# Patient Record
Sex: Male | Born: 1995
Health system: Southern US, Community
[De-identification: ages and names within clinical notes are randomized; demographics above are authoritative.]

## PROBLEM LIST (undated history)

## (undated) DIAGNOSIS — N200 Calculus of kidney: Secondary | ICD-10-CM

## (undated) DIAGNOSIS — F909 Attention-deficit hyperactivity disorder, unspecified type: Secondary | ICD-10-CM

## (undated) HISTORY — PX: OTHER SURGICAL HISTORY: SHX169

---
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
## 2015-12-16 ENCOUNTER — Emergency Department (HOSPITAL_COMMUNITY)
Admission: EM | Admit: 2015-12-16 | Discharge: 2015-12-17 | Disposition: A | Discharge status: Home / Self Care | Payer: Medicaid Other | Attending: Emergency Medicine | Admitting: Emergency Medicine

## 2015-12-16 ENCOUNTER — Emergency Department (HOSPITAL_COMMUNITY): Payer: Medicaid Other

## 2015-12-16 ENCOUNTER — Encounter (HOSPITAL_COMMUNITY): Payer: Self-pay | Admitting: Emergency Medicine

## 2015-12-16 DIAGNOSIS — S4991XA Unspecified injury of right shoulder and upper arm, initial encounter: Secondary | ICD-10-CM | POA: Diagnosis present

## 2015-12-16 DIAGNOSIS — F909 Attention-deficit hyperactivity disorder, unspecified type: Secondary | ICD-10-CM | POA: Diagnosis not present

## 2015-12-16 DIAGNOSIS — Z79899 Other long term (current) drug therapy: Secondary | ICD-10-CM | POA: Insufficient documentation

## 2015-12-16 DIAGNOSIS — Y999 Unspecified external cause status: Secondary | ICD-10-CM | POA: Diagnosis not present

## 2015-12-16 DIAGNOSIS — Y9351 Activity, roller skating (inline) and skateboarding: Secondary | ICD-10-CM | POA: Insufficient documentation

## 2015-12-16 DIAGNOSIS — Y929 Unspecified place or not applicable: Secondary | ICD-10-CM | POA: Diagnosis not present

## 2015-12-16 DIAGNOSIS — S42001A Fracture of unspecified part of right clavicle, initial encounter for closed fracture: Secondary | ICD-10-CM

## 2015-12-16 MED ORDER — IBUPROFEN 800 MG PO TABS
800.0000 mg | ORAL_TABLET | Freq: Once | ORAL | Status: AC
  Administered 2015-12-16: 800 mg via ORAL
  Filled 2015-12-16: qty 1

## 2015-12-16 MED ORDER — OXYCODONE HCL 5 MG PO TABS
5.0000 mg | ORAL_TABLET | Freq: Once | ORAL | Status: AC
  Administered 2015-12-16: 5 mg via ORAL
  Filled 2015-12-16: qty 1

## 2015-12-16 MED ORDER — ACETAMINOPHEN 500 MG PO TABS
1000.0000 mg | ORAL_TABLET | Freq: Once | ORAL | Status: AC
  Administered 2015-12-16: 1000 mg via ORAL
  Filled 2015-12-16: qty 2

## 2015-12-16 MED ORDER — MORPHINE SULFATE 15 MG PO TABS: 15.0000 mg | ORAL_TABLET | ORAL | 0 refills | Status: DC | PRN

## 2015-12-16 NOTE — ED Triage Notes (Signed)
Pt states he was riding his long board and it came out from under him and he landed on the sidewalk on his right shoulder Pt is c/o pain to his right clavicle area  Pt has abrasions noted to his face on the right side  Denies any LOC  States unable to move his right arm due to pain

## 2015-12-16 NOTE — ED Provider Notes (Signed)
[facility]-EMERGENCY DEPT Provider Note   CSN: [id number] Arrival date & time: 12/16/15  1951 By signing my name below, I, Alex Davis, attest that this documentation has been prepared under the direction and in the presence of Melene Planan [name], DO . Electronically Signed: Levon HedgerElizabeth Davis, Scribe. 12/17/2015. 12:02 AM.   History   Chief Complaint Chief Complaint  Patient presents with  . Shoulder Injury    HPI [name] is a 20 y.o. male who presents to the Emergency Department complaining of sudden onset, moderate right shoulder pain  fall. He states he was long boarding when his long board came out from under him, causing him to fall and land on his right side. Pt states the pain does not radiate. No alleviating or modifying factors noted. Pt denies syncope or neck pain.  Per mother, pt was seen in the ED s/p fall one year ago and was diagnosed with a concussion. Pt states he feels as if his concussion has never gone away and notes a constant headache.    The history is provided by the patient.  No language interpreter was used.    Past Medical History:  Diagnosis Date  . ADHD (attention deficit hyperactivity disorder)    off all medications for 6 months  . Kidney stone   . Kidney stones 09/2012   left ureteral stone    There are no active problems to display for this patient.   Past Surgical History:  Procedure Laterality Date  . laser stone removal       Home Medications    Prior to Admission medications   Medication Sig Start Date End Date Taking? Authorizing Provider  HYDROmorphone (DILAUDID) 4 MG tablet Take 0.5-1 tablets (2-4 mg total) by mouth every 4 (four) hours as needed for pain. Patient not taking: Reported on 11/03/2014 09/24/12   Paula LibraJohn Molpus, MD  morphine (MSIR) 15 MG tablet Take 1 tablet (15 mg total) by mouth every 4 (four) hours as needed for severe pain. 12/16/15   Melene Planan Tinsley Lomas, DO  ondansetron (ZOFRAN ODT) 8 MG disintegrating tablet Take 1 tablet (8 mg total) by  mouth every 8 (eight) hours as needed. Patient not taking: Reported on 11/03/2014 09/24/12   Paula LibraJohn Molpus, MD  tamsulosin (FLOMAX) 0.4 MG CAPS Take 1 capsule daily until stone passes. Patient not taking: Reported on 11/03/2014 09/24/12   Paula LibraJohn Molpus, MD    Family History Family History  Problem Relation Age of Onset  . Stroke Other   . Hypertension Other   . Diabetes Other     Social History Social History  Substance Use Topics  . Smoking status: Never Smoker  . Smokeless tobacco: Never Used  . Alcohol use No     Allergies   Review of patient's allergies indicates no known allergies.   Review of Systems Review of Systems  Constitutional: Negative for chills and fever.  HENT: Negative for congestion and facial swelling.   Eyes: Negative for discharge and visual disturbance.  Respiratory: Negative for shortness of breath.   Cardiovascular: Negative for chest pain and palpitations.  Gastrointestinal: Negative for abdominal pain, diarrhea and vomiting.  Musculoskeletal: Positive for arthralgias. Negative for myalgias.  Skin: Negative for color change and rash.  Neurological: Negative for tremors, syncope and headaches.  Psychiatric/Behavioral: Negative for confusion and dysphoric mood.   Physical Exam Updated Vital Signs BP 133/99 (BP Location: Left Arm)   Pulse 66   Temp 98.5 F (36.9 C) (Oral)   Resp 18   SpO2  98%   Physical Exam  Constitutional: He is oriented to person, place, and time. He appears well-developed and well-nourished.  HENT:  Head: Normocephalic and atraumatic.  Eyes: EOM are normal. Pupils are equal, round, and reactive to light.  Neck: Normal range of motion. Neck supple. No JVD present.  Cardiovascular: Normal rate and regular rhythm.  Exam reveals no gallop and no friction rub.   No murmur heard. Pulmonary/Chest: No respiratory distress. He has no wheezes.  Abdominal: He exhibits no distension. There is no rebound and no guarding.    Musculoskeletal: Normal range of motion. He exhibits tenderness.  TTP to the anterior aspect of his shoulder No c-spine tenderness Small abrasion to right side of his head No signs of basal skull fracture   Neurological: He is alert and oriented to person, place, and time.  Skin: No rash noted. No pallor.  Psychiatric: He has a normal mood and affect. His behavior is normal.  Nursing note and vitals reviewed.  ED Treatments / Results  DIAGNOSTIC STUDIES:  Oxygen Saturation is 100% on room air, normal by my interpretation.    COORDINATION OF CARE:  11:47 PM Discussed treatment plan with pt at bedside and pt agreed to plan.  Labs (all labs ordered are listed, but only abnormal results are displayed) Labs Reviewed - No data to display  EKG  EKG Interpretation None       Radiology Dg Shoulder Right  Result Date: 12/16/2015 CLINICAL DATA:  Pain after falling from skateboard EXAM: RIGHT SHOULDER - 2+ VIEW COMPARISON:  None. FINDINGS: Two views of the right shoulder submitted. There is displaced fracture of distal right clavicle. Glenohumeral joint is preserved. IMPRESSION: Displaced fracture of distal right clavicle. Electronically Signed   By: Natasha Mead M.D.   On: 12/16/2015 20:40    Procedures Procedures (including critical care time)  Medications Ordered in ED Medications  ibuprofen (ADVIL,MOTRIN) tablet 800 mg (800 mg Oral Given 12/16/15 2353)  oxyCODONE (Oxy IR/ROXICODONE) immediate release tablet 5 mg (5 mg Oral Given 12/16/15 2352)  acetaminophen (TYLENOL) tablet 1,000 mg (1,000 mg Oral Given 12/16/15 2352)     Initial Impression / Assessment and Plan / ED Course  I have reviewed the triage vital signs and the nursing notes.  Pertinent labs & imaging results that were available during my care of the patient were reviewed by me and considered in my medical decision making (see chart for details).  Clinical Course    20 yo M With a chief complaint of right shoulder  pain. Patient has tenderness about the shoulder pulse motor and sensation is intact distally. Found to have a distal third clavicle fracture. Orthopedics follow-up.  12:02 AM:  I have discussed the diagnosis/risks/treatment options with the patient and family and believe the pt to be eligible for discharge home to follow-up with Ortho. We also discussed returning to the ED immediately if new or worsening sx occur. We discussed the sx which are most concerning (e.g., ongoing severe pain, confusion) that necessitate immediate return. Medications administered to the patient during their visit and any new prescriptions provided to the patient are listed below.  Medications given during this visit Medications  ibuprofen (ADVIL,MOTRIN) tablet 800 mg (800 mg Oral Given 12/16/15 2353)  oxyCODONE (Oxy IR/ROXICODONE) immediate release tablet 5 mg (5 mg Oral Given 12/16/15 2352)  acetaminophen (TYLENOL) tablet 1,000 mg (1,000 mg Oral Given 12/16/15 2352)     The patient appears reasonably screen and/or stabilized for discharge and I doubt any  other medical condition or other [facility] requiring further screening, evaluation, or treatment in the ED at this time prior to discharge.    Final Clinical Impressions(s) / ED Diagnoses   Final diagnoses:  Clavicle fracture, right, closed, initial encounter   I personally performed the services described in this documentation, which was scribed in my presence. The recorded information has been reviewed and is accurate.    New Prescriptions New Prescriptions   MORPHINE (MSIR) 15 MG TABLET    Take 1 tablet (15 mg total) by mouth every 4 (four) hours as needed for severe pain.    Melene Plan, DO 12/17/15 0002

## 2015-12-17 NOTE — Discharge Instructions (Signed)

## 2017-02-03 ENCOUNTER — Encounter (HOSPITAL_COMMUNITY): Payer: Self-pay | Admitting: Emergency Medicine

## 2017-02-03 ENCOUNTER — Emergency Department (HOSPITAL_COMMUNITY)
Admission: EM | Admit: 2017-02-03 | Discharge: 2017-02-03 | Disposition: A | Discharge status: Home / Self Care | Payer: Medicaid Other | Attending: Emergency Medicine | Admitting: Emergency Medicine

## 2017-02-03 DIAGNOSIS — M545 Low back pain, unspecified: Secondary | ICD-10-CM

## 2017-02-03 DIAGNOSIS — R109 Unspecified abdominal pain: Secondary | ICD-10-CM | POA: Diagnosis present

## 2017-02-03 LAB — CBC
HCT: 46.5 % (ref 39.0–52.0)
Hemoglobin: 16.4 g/dL (ref 13.0–17.0)
MCH: 30.8 pg (ref 26.0–34.0)
MCHC: 35.3 g/dL (ref 30.0–36.0)
MCV: 87.4 fL (ref 78.0–100.0)
Platelets: 251 10*3/uL (ref 150–400)
RBC: 5.32 MIL/uL (ref 4.22–5.81)
RDW: 12.6 % (ref 11.5–15.5)
WBC: 7.5 10*3/uL (ref 4.0–10.5)

## 2017-02-03 LAB — BASIC METABOLIC PANEL WITH GFR
Anion gap: 8 (ref 5–15)
BUN: 9 mg/dL (ref 6–20)
CO2: 28 mmol/L (ref 22–32)
Calcium: 9.3 mg/dL (ref 8.9–10.3)
Chloride: 104 mmol/L (ref 101–111)
Creatinine, Ser: 0.84 mg/dL (ref 0.61–1.24)
GFR calc Af Amer: >60 mL/min
GFR calc non Af Amer: >60 mL/min
Glucose, Bld: 98 mg/dL (ref 65–99)
Potassium: 4.3 mmol/L (ref 3.5–5.1)
Sodium: 140 mmol/L (ref 135–145)

## 2017-02-03 LAB — URINALYSIS, ROUTINE W REFLEX MICROSCOPIC
BILIRUBIN URINE: NEGATIVE
Glucose, UA: NEGATIVE mg/dL
Hgb urine dipstick: NEGATIVE
Ketones, ur: NEGATIVE mg/dL
Leukocytes, UA: NEGATIVE
NITRITE: NEGATIVE
PH: 6 (ref 5.0–8.0)
PROTEIN: NEGATIVE mg/dL
Specific Gravity, Urine: 1.005 (ref 1.005–1.030)

## 2017-02-03 MED ORDER — ACETAMINOPHEN 500 MG PO TABS
1000.0000 mg | ORAL_TABLET | Freq: Once | ORAL | Status: AC
  Administered 2017-02-03: 1000 mg via ORAL
  Filled 2017-02-03: qty 2

## 2017-02-03 NOTE — ED Notes (Signed)
Pt provided with heat pack for pain control

## 2017-02-03 NOTE — ED Triage Notes (Addendum)
Per pt, states right and left flank pain radiating to his stomach-states it is twisting-states history of kidney stones-states pain feels like that-started 1 1/2 hours ago-no dysuria, no N/V

## 2017-02-03 NOTE — Discharge Instructions (Signed)
Take Tylenol as directed for pain.  Call the number on these discharge instructions to get a primary care physician and to be seen if not feeling better in a week.  Return if concern for any reason or see an urgent care center

## 2017-02-03 NOTE — ED Provider Notes (Addendum)
[facility]-EMERGENCY DEPT Provider Note   CSN: [id number] Arrival date & time: 02/03/17  1418     History   Chief Complaint Chief Complaint  Patient presents with  . Flank Pain    HPI Alex Davis is a 21 y.o. male.  HPI Complains of right-sided paralumbar pain onset 12 noon today pain radiates to both sides of his abdomen, diffusely.  It does not feel like kidney stones he had in the past which was unilateral.  He denies any urinary symptoms denies nausea or vomiting denies appetite change denies fever pain is made worse with movement or changing positions and improved with remaining still.  Last bowel movement 1 PM normal pain is mild at present and has improved since onset Past Medical History:  Diagnosis Date  . ADHD (attention deficit hyperactivity disorder)    off all medications for 6 months  . Kidney stone   . Kidney stones 09/2012   left ureteral stone    There are no active problems to display for this patient.   Past Surgical History:  Procedure Laterality Date  . laser stone removal         Home Medications    Prior to Admission medications   Medication Sig Start Date End Date Taking? Authorizing Provider  HYDROmorphone (DILAUDID) 4 MG tablet Take 0.5-1 tablets (2-4 mg total) by mouth every 4 (four) hours as needed for pain. Patient not taking: Reported on 11/03/2014 09/24/12   Molpus, Jonny Ruiz, MD  morphine (MSIR) 15 MG tablet Take 1 tablet (15 mg total) by mouth every 4 (four) hours as needed for severe pain. 12/16/15   Melene Plan, DO  ondansetron (ZOFRAN ODT) 8 MG disintegrating tablet Take 1 tablet (8 mg total) by mouth every 8 (eight) hours as needed. Patient not taking: Reported on 11/03/2014 09/24/12   Molpus, John, MD  tamsulosin (FLOMAX) 0.4 MG CAPS Take 1 capsule daily until stone passes. Patient not taking: Reported on 11/03/2014 09/24/12   Molpus, Jonny Ruiz, MD    Family History Family History  Problem Relation Age of Onset  .  Stroke Other   . Hypertension Other   . Diabetes Other     Social History Social History  Substance Use Topics  . Smoking status: Never Smoker  . Smokeless tobacco: Never Used  . Alcohol use No     Allergies   Patient has no known allergies.   Review of Systems Review of Systems  Constitutional: Negative.   HENT: Negative.   Respiratory: Negative.   Cardiovascular: Negative.   Gastrointestinal: Positive for abdominal pain.  Musculoskeletal: Positive for back pain.  Skin: Negative.   Neurological: Negative.   Psychiatric/Behavioral: Negative.   All other systems reviewed and are negative.    Physical Exam Updated Vital Signs BP 138/79 (BP Location: Left Arm)   Pulse 95   Temp 98.2 F (36.8 C) (Oral)   Resp 17   SpO2 98%   Physical Exam  Constitutional: He is oriented to person, place, and time. He appears well-developed and well-nourished.  HENT:  Head: Normocephalic and atraumatic.  Eyes: Pupils are equal, round, and reactive to light. Conjunctivae are normal.  Neck: Neck supple. No tracheal deviation present. No thyromegaly present.  Cardiovascular: Normal rate and regular rhythm.   No murmur heard. Pulmonary/Chest: Effort normal and breath sounds normal.  Abdominal: Soft. Bowel sounds are normal. He exhibits no distension. There is no tenderness.  Genitourinary: Penis normal.  Musculoskeletal: Normal range of motion. He exhibits  no edema or tenderness.  No tenderness along spine.  Has bilateral paralumbar pain upon sitting up from supine position  Neurological: He is alert and oriented to person, place, and time. No cranial nerve deficit. Coordination normal.  Gait normal  Skin: Skin is warm and dry. No rash noted.  Psychiatric: He has a normal mood and affect.  Nursing note and vitals reviewed.    ED Treatments / Results  Labs (all labs ordered are listed, but only abnormal results are displayed) Labs Reviewed  BASIC METABOLIC PANEL  CBC   URINALYSIS, ROUTINE W REFLEX MICROSCOPIC    EKG  EKG Interpretation None       Radiology No results found.  Procedures Procedures (including critical care time)  Medications Ordered in ED Medications  acetaminophen (TYLENOL) tablet 1,000 mg (not administered)   Results for orders placed or performed during the hospital encounter of 02/03/17  Urinalysis, Routine w reflex microscopic- may I&O cath if menses  Result Value Ref Range   Color, Urine STRAW (A) YELLOW   APPearance CLEAR CLEAR   Specific Gravity, Urine 1.005 1.005 - 1.030   pH 6.0 5.0 - 8.0   Glucose, UA NEGATIVE NEGATIVE mg/dL   Hgb urine dipstick NEGATIVE NEGATIVE   Bilirubin Urine NEGATIVE NEGATIVE   Ketones, ur NEGATIVE NEGATIVE mg/dL   Protein, ur NEGATIVE NEGATIVE mg/dL   Nitrite NEGATIVE NEGATIVE   Leukocytes, UA NEGATIVE NEGATIVE  Basic metabolic panel  Result Value Ref Range   Sodium 140 135 - 145 mmol/L   Potassium 4.3 3.5 - 5.1 mmol/L   Chloride 104 101 - 111 mmol/L   CO2 28 22 - 32 mmol/L   Glucose, Bld 98 65 - 99 mg/dL   BUN 9 6 - 20 mg/dL   Creatinine, Ser 4.690.84 0.61 - 1.24 mg/dL   Calcium 9.3 8.9 - 62.910.3 mg/dL   GFR calc non Af Amer >60 >60 mL/min   GFR calc Af Amer >60 >60 mL/min   Anion gap 8 5 - 15  CBC  Result Value Ref Range   WBC 7.5 4.0 - 10.5 K/uL   RBC 5.32 4.22 - 5.81 MIL/uL   Hemoglobin 16.4 13.0 - 17.0 g/dL   HCT 52.846.5 41.339.0 - 24.452.0 %   MCV 87.4 78.0 - 100.0 fL   MCH 30.8 26.0 - 34.0 pg   MCHC 35.3 30.0 - 36.0 g/dL   RDW 01.012.6 27.211.5 - 53.615.5 %   Platelets 251 150 - 400 K/uL   No results found.  Initial Impression / Assessment and Plan / ED Course  I have reviewed the triage vital signs and the nursing notes.  Pertinent labs & imaging results that were available during my care of the patient were reviewed by me and considered in my medical decision making (see chart for details).     5 PM discomfort continues to improve after treatment with Tylenol. Abdomen is completely  nontender.  Pain seems to be muscular in etiology and coming from his low back. Plan Tylenol as needed for pain and referral primary care Final Clinical Impressions(s) / ED Diagnoses  Diagnosis acute bilateral low back pain without sciatica Final diagnoses:  None    New Prescriptions New Prescriptions   No medications on file     Doug SouJacubowitz, Lougenia Morrissey, MD 02/03/17 1704    Doug SouJacubowitz, Ieasha Boerema, MD 02/03/17 1706    Doug SouJacubowitz, Mattisen Pohlmann, MD 02/03/17 [phone]

## 2017-10-24 ENCOUNTER — Emergency Department (HOSPITAL_BASED_OUTPATIENT_CLINIC_OR_DEPARTMENT_OTHER)
Admission: EM | Admit: 2017-10-24 | Discharge: 2017-10-24 | Disposition: A | Discharge status: Home / Self Care | Payer: Medicaid Other | Attending: Emergency Medicine | Admitting: Emergency Medicine

## 2017-10-24 ENCOUNTER — Other Ambulatory Visit: Payer: Self-pay

## 2017-10-24 ENCOUNTER — Encounter (HOSPITAL_BASED_OUTPATIENT_CLINIC_OR_DEPARTMENT_OTHER): Payer: Self-pay | Admitting: Emergency Medicine

## 2017-10-24 ENCOUNTER — Emergency Department (HOSPITAL_BASED_OUTPATIENT_CLINIC_OR_DEPARTMENT_OTHER): Payer: Medicaid Other

## 2017-10-24 DIAGNOSIS — F909 Attention-deficit hyperactivity disorder, unspecified type: Secondary | ICD-10-CM | POA: Insufficient documentation

## 2017-10-24 DIAGNOSIS — R51 Headache: Secondary | ICD-10-CM | POA: Insufficient documentation

## 2017-10-24 DIAGNOSIS — H5712 Ocular pain, left eye: Secondary | ICD-10-CM | POA: Diagnosis not present

## 2017-10-24 DIAGNOSIS — G8929 Other chronic pain: Secondary | ICD-10-CM | POA: Insufficient documentation

## 2017-10-24 LAB — CBC WITH DIFFERENTIAL/PLATELET
BASOS PCT: 1 %
Basophils Absolute: 0.1 10*3/uL (ref 0.0–0.1)
Eosinophils Absolute: 0.1 10*3/uL (ref 0.0–0.7)
Eosinophils Relative: 1 %
HEMATOCRIT: 44 % (ref 39.0–52.0)
Hemoglobin: 15.4 g/dL (ref 13.0–17.0)
LYMPHS PCT: 33 %
Lymphs Abs: 2.4 10*3/uL (ref 0.7–4.0)
MCH: 30 pg (ref 26.0–34.0)
MCHC: 35 g/dL (ref 30.0–36.0)
MCV: 85.6 fL (ref 78.0–100.0)
MONOS PCT: 11 %
Monocytes Absolute: 0.8 10*3/uL (ref 0.1–1.0)
NEUTROS ABS: 3.8 10*3/uL (ref 1.7–7.7)
Neutrophils Relative %: 54 %
Platelets: 237 10*3/uL (ref 150–400)
RBC: 5.14 MIL/uL (ref 4.22–5.81)
RDW: 12.5 % (ref 11.5–15.5)
WBC: 7.1 10*3/uL (ref 4.0–10.5)

## 2017-10-24 LAB — BASIC METABOLIC PANEL
ANION GAP: 7 (ref 5–15)
BUN: 9 mg/dL (ref 6–20)
CALCIUM: 8.8 mg/dL — AB (ref 8.9–10.3)
CO2: 28 mmol/L (ref 22–32)
Chloride: 104 mmol/L (ref 98–111)
Creatinine, Ser: 0.77 mg/dL (ref 0.61–1.24)
GFR calc Af Amer: >60 mL/min (ref >60)
GFR calc non Af Amer: >60 mL/min (ref >60)
GLUCOSE: 92 mg/dL (ref 70–99)
Potassium: 4.5 mmol/L (ref 3.5–5.1)
Sodium: 139 mmol/L (ref 135–145)

## 2017-10-24 MED ORDER — TETRACAINE HCL 0.5 % OP SOLN
2.0000 [drp] | Freq: Once | OPHTHALMIC | Status: AC
  Administered 2017-10-24: 2 [drp] via OPHTHALMIC
  Filled 2017-10-24: qty 4

## 2017-10-24 MED ORDER — FLUORESCEIN SODIUM 1 MG OP STRP
1.0000 | ORAL_STRIP | Freq: Once | OPHTHALMIC | Status: AC
  Administered 2017-10-24: 1 via OPHTHALMIC
  Filled 2017-10-24: qty 1

## 2017-10-24 MED ORDER — IOPAMIDOL (ISOVUE-300) INJECTION 61%: 75.0000 mL | Freq: Once | INTRAVENOUS | Status: DC | PRN

## 2017-10-24 MED ORDER — IOPAMIDOL (ISOVUE-300) INJECTION 61%
100.0000 mL | Freq: Once | INTRAVENOUS | Status: AC | PRN
  Administered 2017-10-24: 75 mL via INTRAVENOUS

## 2017-10-24 MED ORDER — BUTALBITAL-APAP-CAFFEINE 50-325-40 MG PO TABS: 1.0000 | ORAL_TABLET | Freq: Four times a day (QID) | ORAL | 0 refills | Status: AC | PRN

## 2017-10-24 MED FILL — BUTALB-ACETAMIN-CAFF 50-325: 50-325-40 | 1 days supply | Qty: 6 | Fill #0

## 2017-10-24 NOTE — ED Provider Notes (Signed)
[facility] EMERGENCY DEPARTMENT Provider Note   CSN: [id number] Arrival date & time: 10/24/17  1012     History   Chief Complaint Chief Complaint  Patient presents with  . Eye Pain    HPI [name] is a 22 y.o. male since emergency department chief complaint of left eye pain.  Symptom has been present for about 2 weeks.  He states that the pain is worse with eye movement.  He feels pressure behind the eye.  He complains of photophobia to bright light like the sign.  He has chronic headaches.  He denies nausea, vomiting or other neurologic symptoms.  He has no known injuries to the eye.  He does not wear glasses or contact lenses.  His mother states that he has been laying in a dark room more frequently for the past 2 weeks.  He has no other contributing past medical history specifically no autoimmune disorders.  HPI  Past Medical History:  Diagnosis Date  . ADHD (attention deficit hyperactivity disorder)    off all medications for 6 months  . Kidney stone   . Kidney stones 09/2012   left ureteral stone    There are no active problems to display for this patient.   Past Surgical History:  Procedure Laterality Date  . laser stone removal          Home Medications    Prior to Admission medications   Not on File    Family History Family History  Problem Relation Age of Onset  . Stroke Other   . Hypertension Other   . Diabetes Other     Social History Social History   Tobacco Use  . Smoking status: Never Smoker  . Smokeless tobacco: Never Used  Substance Use Topics  . Alcohol use: No  . Drug use: No     Allergies   Patient has no known allergies.   Review of Systems Review of Systems  Ten systems reviewed and are negative for acute change, except as noted in the HPI.   Physical Exam Updated Vital Signs BP 123/73 (BP Location: Right Arm)   Pulse 80   Temp 98.2 F (36.8 C) (Oral)   Resp 18   Ht 6\' 1"  (1.854 m)   Wt 102.1 kg  (225 lb)   SpO2 98%   BMI 29.69 kg/m   Physical Exam  Constitutional: He appears well-developed and well-nourished. No distress.  HENT:  Head: Normocephalic and atraumatic.  Eyes: Pupils are equal, round, and reactive to light. Conjunctivae and EOM are normal. Right eye exhibits no chemosis and no discharge. No foreign body present in the right eye. Left eye exhibits no chemosis and no discharge. No foreign body present in the left eye. Right conjunctiva is not injected. Left conjunctiva is not injected. No scleral icterus.  Slit lamp exam:      The right eye shows no corneal abrasion, no corneal ulcer, no foreign body and no fluorescein uptake.       The left eye shows no corneal abrasion, no corneal ulcer, no foreign body and no fluorescein uptake.    Neck: Normal range of motion. Neck supple.  Cardiovascular: Normal rate, regular rhythm and normal heart sounds.  Pulmonary/Chest: Effort normal and breath sounds normal. No respiratory distress.  Abdominal: Soft. There is no tenderness.  Musculoskeletal: He exhibits no edema.  Neurological: He is alert.  Skin: Skin is warm and dry. He is not diaphoretic.  Psychiatric: His behavior is normal.  Nursing note and vitals reviewed.    ED Treatments / Results  Labs (all labs ordered are listed, but only abnormal results are displayed) Labs Reviewed  BASIC METABOLIC PANEL  CBC WITH DIFFERENTIAL/PLATELET    EKG None  Radiology No results found.  Procedures Procedures (including critical care time)  Medications Ordered in ED Medications  fluorescein ophthalmic strip 1 strip (1 strip Both Eyes Given by Other 10/24/17 1143)  tetracaine (PONTOCAINE) 0.5 % ophthalmic solution 2 drop (2 drops Both Eyes Given by Other 10/24/17 1143)  iopamidol (ISOVUE-300) 61 % injection 100 mL (75 mLs Intravenous Contrast Given 10/24/17 1114)     Initial Impression / Assessment and Plan / ED Course  I have reviewed the triage vital signs and the  nursing notes.  Pertinent labs & imaging results that were available during my care of the patient were reviewed by me and considered in my medical decision making (see chart for details).     Patient with left eye pain.  Scans are negative for any acute abnormality including retrobulbar abscess, septal hematoma.  No indirect photophobia suggestive of uveitis/iritis. ?  Ocular migraine We will treat with a few tablets of Fioricet which may help.  Patient is advised to follow closely with ophthalmology.  Normal pressures and no evidence of acute angle-closure glaucoma, corneal abrasion or ulceration.  Final Clinical Impressions(s) / ED Diagnoses   Final diagnoses:  Left eye pain    ED Discharge Orders    None       Arthor Captain, PA-C 10/24/17 1932    Azalia Bilis, MD 10/25/17 1031

## 2017-10-24 NOTE — Discharge Instructions (Signed)
Get help right away if:  You have severe eye pain even when you are not in bright lights.  You have photophobia and you also have:  Blurry vision.  A headache.  Red eyes.  You have neck pain.

## 2017-10-24 NOTE — ED Triage Notes (Signed)
Left eye pain with some blurry vision from 2-4 weeks.  No fever, no eye drainage.  Denies injury

## 2018-12-29 IMAGING — CT CT HEAD W/O CM
3 series · 15 of 47 positions shown, 18 images · non-contrast
Comparison: None.

CLINICAL DATA: Headaches and dizziness.  Ophthalmoplegia.

EXAM:
CT HEAD WITHOUT CONTRAST
TECHNIQUE: Contiguous axial images were obtained from the base of the skull
through the vertex without intravenous contrast.

[Series 3: head wo · axial · 0.46mm/px · z∈[+992,+1118]mm · 9 of 30 slices shown, 12 images]
[im 3/30  brain]
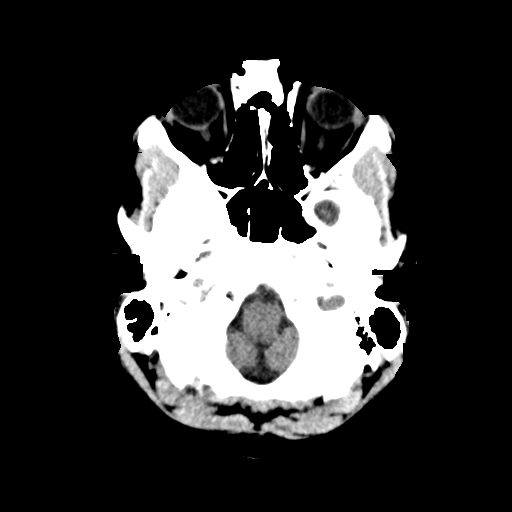
[im 3/30  bone]
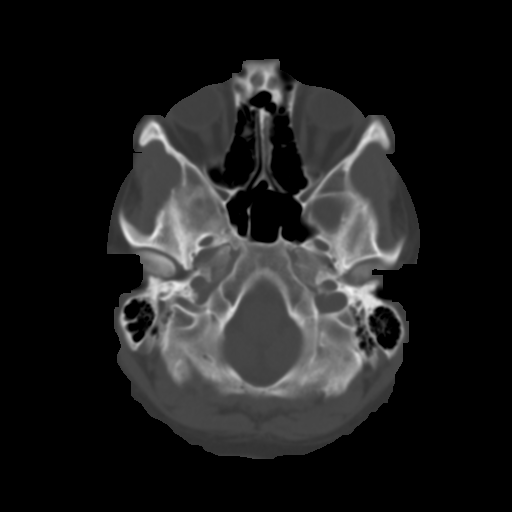
[im 6/30  brain]
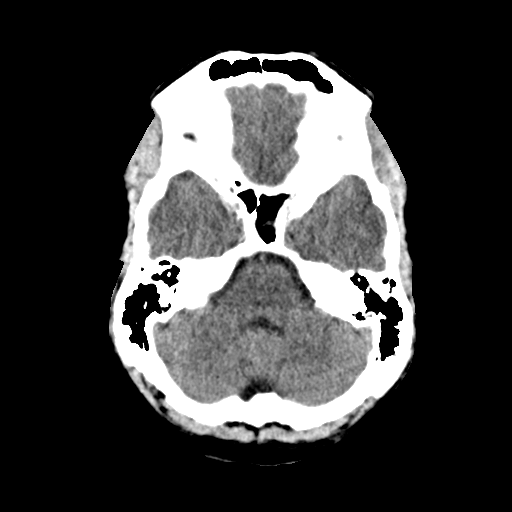
[im 9/30  brain]
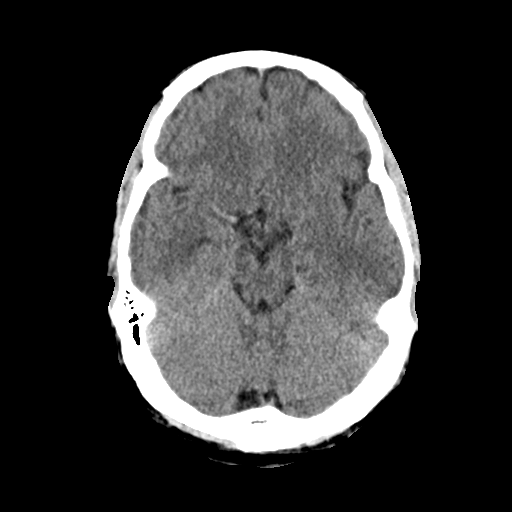
[im 12/30  brain]
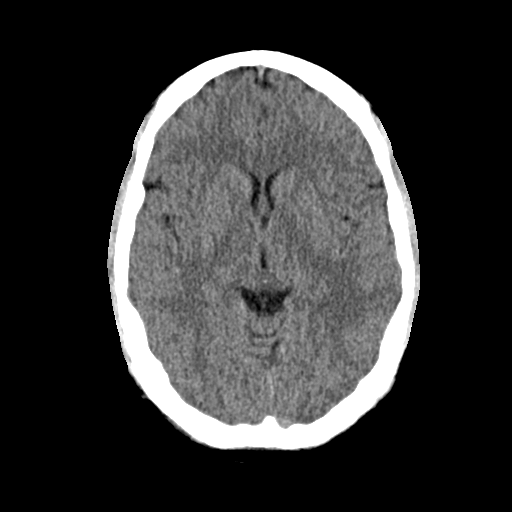
[im 16/30  brain]
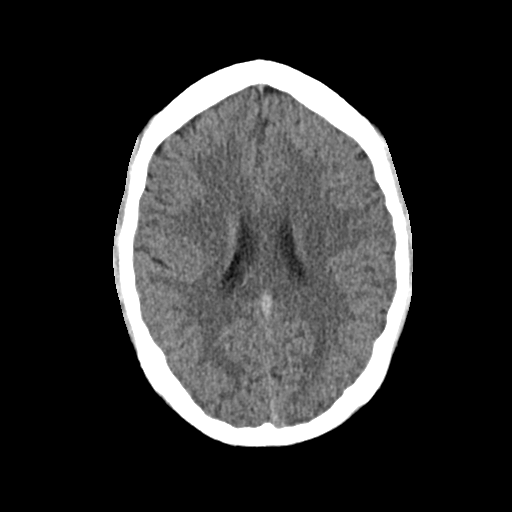
[im 16/30  bone]
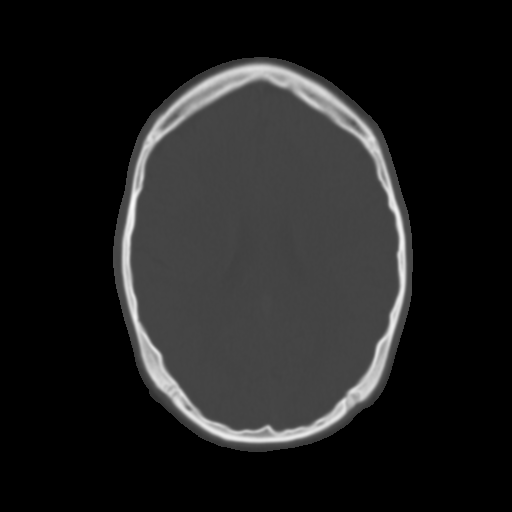
[im 19/30  brain]
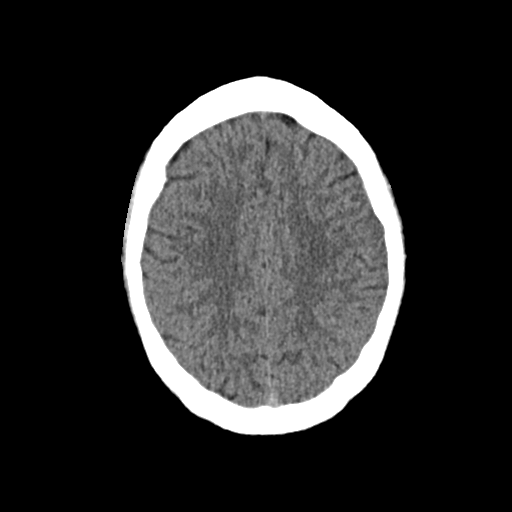
[im 22/30  brain]
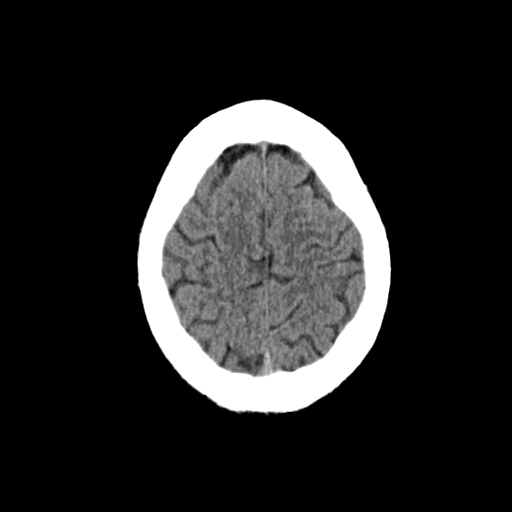
[im 25/30  brain]
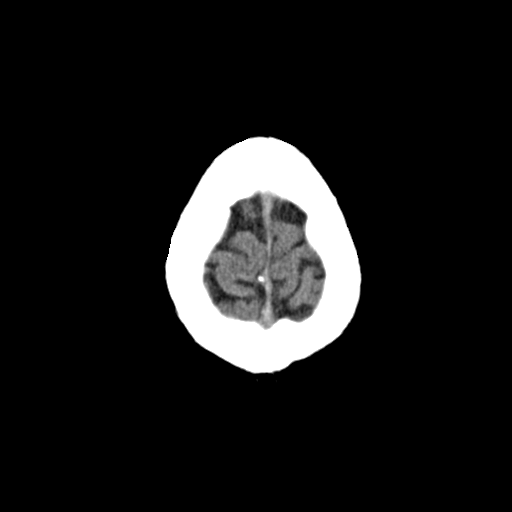
[im 28/30  brain]
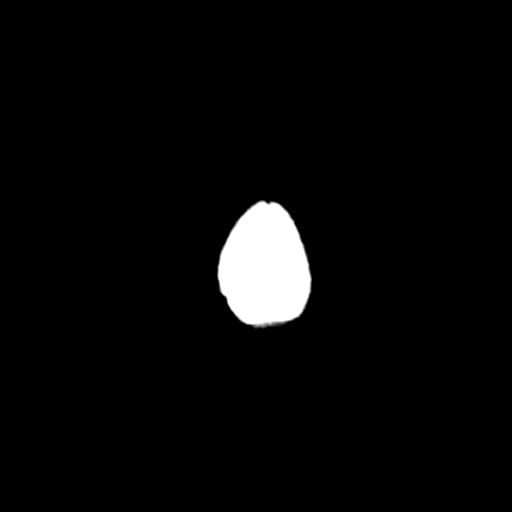
[im 28/30  bone]
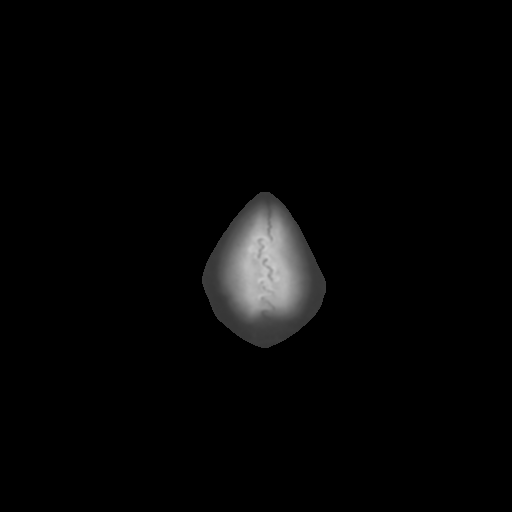

[Series 5: coronal soft · coronal · 0.29mm/px · 3 of 69 slices shown]
[im 23/69  brain]
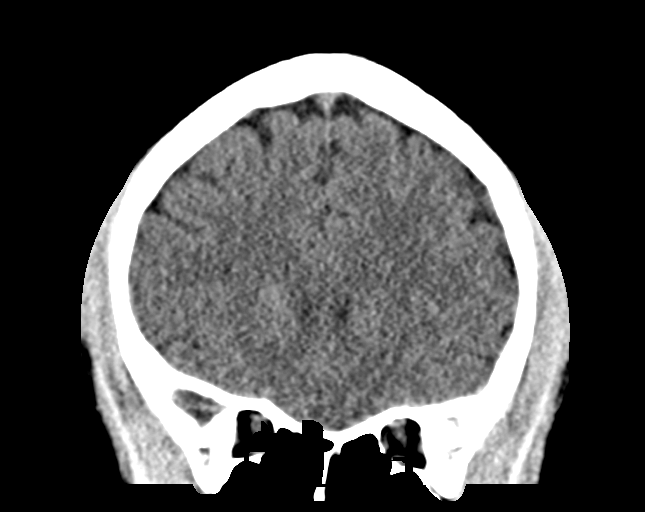
[im 31/69  brain]
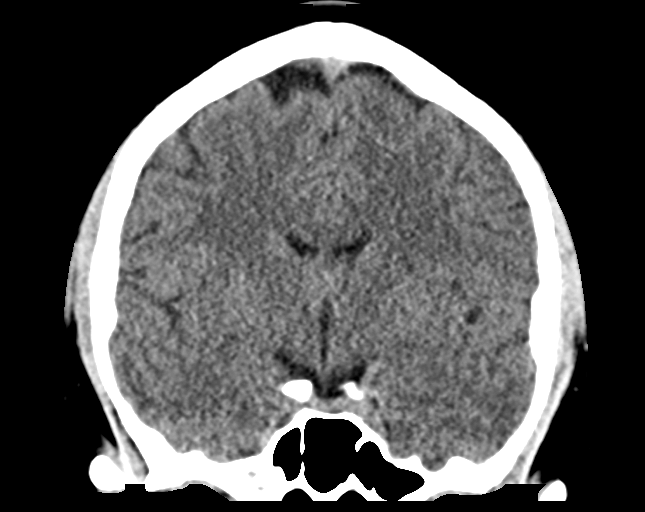
[im 38/69  brain]
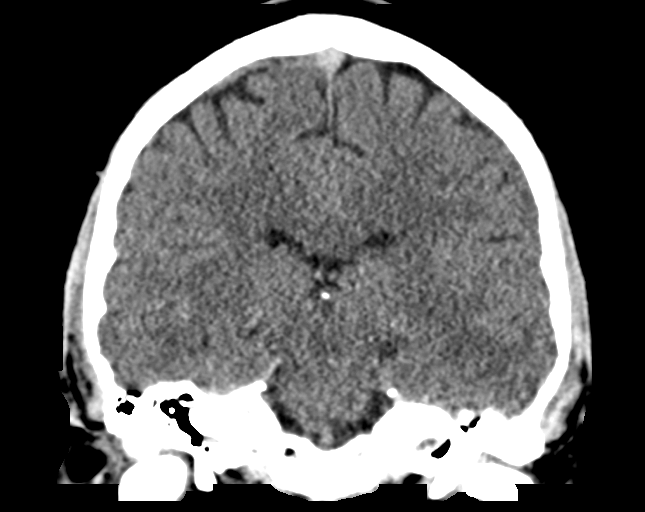

[Series 6: sag soft · sagittal · 0.30mm/px · 3 of 60 slices shown]
[im 20/60  brain]
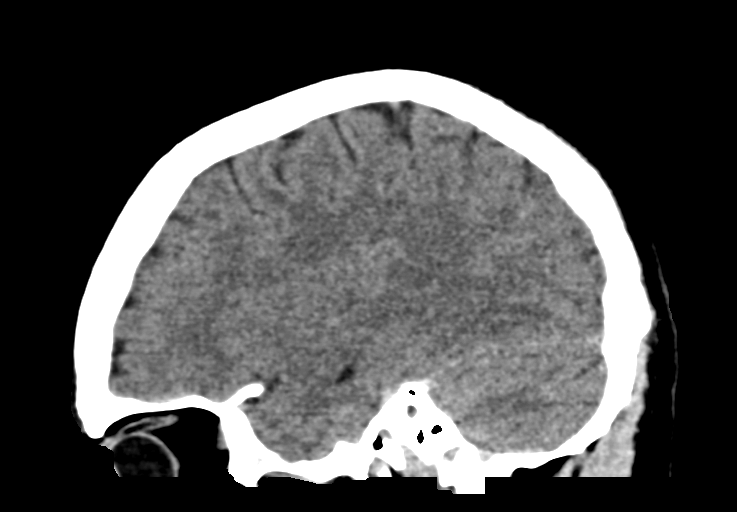
[im 30/60  brain]
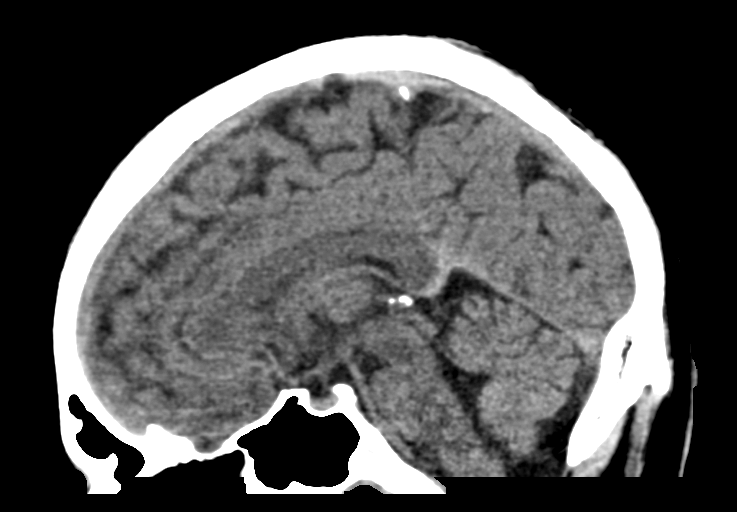
[im 40/60  brain]
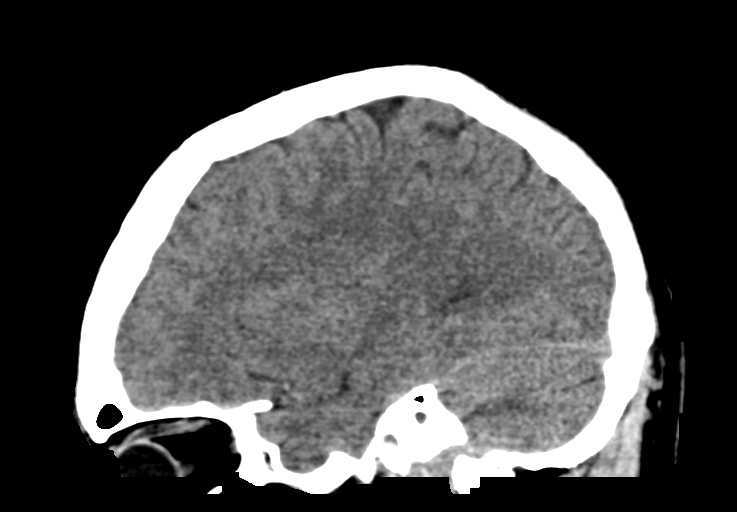

[15 of 47 positions shown; findings below may reference images not displayed]

FINDINGS: Brain: No acute infarct, hemorrhage, or mass lesion is present. The
ventricles are of normal size. No significant white matter disease
is present. The brainstem and cerebellum are normal.

Vascular: No hyperdense vessel or unexpected calcification.

Skull: Calvarium is intact. No focal lytic or blastic lesions are
present. No extracranial soft tissue lesions are present.

Sinuses/Orbits: The paranasal sinuses and mastoid air cells are
clear.
IMPRESSION: Negative CT of the head without contrast

## 2021-11-22 ENCOUNTER — Emergency Department (HOSPITAL_BASED_OUTPATIENT_CLINIC_OR_DEPARTMENT_OTHER): Payer: Medicare Other

## 2021-11-22 ENCOUNTER — Other Ambulatory Visit: Payer: Self-pay

## 2021-11-22 ENCOUNTER — Encounter (HOSPITAL_BASED_OUTPATIENT_CLINIC_OR_DEPARTMENT_OTHER): Payer: Self-pay | Admitting: Urology

## 2021-11-22 ENCOUNTER — Emergency Department (HOSPITAL_BASED_OUTPATIENT_CLINIC_OR_DEPARTMENT_OTHER)
Admission: EM | Admit: 2021-11-22 | Discharge: 2021-11-22 | Disposition: A | Discharge status: Home / Self Care | Payer: Medicare Other | Attending: Emergency Medicine | Admitting: Emergency Medicine

## 2021-11-22 DIAGNOSIS — M25562 Pain in left knee: Secondary | ICD-10-CM | POA: Insufficient documentation

## 2021-11-22 DIAGNOSIS — M25551 Pain in right hip: Secondary | ICD-10-CM | POA: Insufficient documentation

## 2021-11-22 DIAGNOSIS — R0602 Shortness of breath: Secondary | ICD-10-CM | POA: Diagnosis not present

## 2021-11-22 DIAGNOSIS — R1084 Generalized abdominal pain: Secondary | ICD-10-CM | POA: Insufficient documentation

## 2021-11-22 DIAGNOSIS — M2550 Pain in unspecified joint: Secondary | ICD-10-CM | POA: Diagnosis not present

## 2021-11-22 DIAGNOSIS — M25561 Pain in right knee: Secondary | ICD-10-CM | POA: Diagnosis not present

## 2021-11-22 DIAGNOSIS — M25552 Pain in left hip: Secondary | ICD-10-CM | POA: Insufficient documentation

## 2021-11-22 DIAGNOSIS — R209 Unspecified disturbances of skin sensation: Secondary | ICD-10-CM | POA: Diagnosis not present

## 2021-11-22 DIAGNOSIS — R079 Chest pain, unspecified: Secondary | ICD-10-CM | POA: Insufficient documentation

## 2021-11-22 LAB — URINALYSIS, ROUTINE W REFLEX MICROSCOPIC
Bilirubin Urine: NEGATIVE
Glucose, UA: NEGATIVE mg/dL
Hgb urine dipstick: NEGATIVE
Ketones, ur: 15 mg/dL — AB
Leukocytes,Ua: NEGATIVE
Nitrite: NEGATIVE
Protein, ur: NEGATIVE mg/dL
Specific Gravity, Urine: 1.015 (ref 1.005–1.030)
pH: 6 (ref 5.0–8.0)

## 2021-11-22 LAB — COMPREHENSIVE METABOLIC PANEL
ALT: 18 U/L (ref 0–44)
AST: 15 U/L (ref 15–41)
Albumin: 4.7 g/dL (ref 3.5–5.0)
Alkaline Phosphatase: 70 U/L (ref 38–126)
Anion gap: 7 (ref 5–15)
BUN: 11 mg/dL (ref 6–20)
CO2: 28 mmol/L (ref 22–32)
Calcium: 9.4 mg/dL (ref 8.9–10.3)
Chloride: 103 mmol/L (ref 98–111)
Creatinine, Ser: 0.81 mg/dL (ref 0.61–1.24)
GFR, Estimated: >60 mL/min (ref >60)
Glucose, Bld: 100 mg/dL — ABNORMAL HIGH (ref 70–99)
Potassium: 4.2 mmol/L (ref 3.5–5.1)
Sodium: 138 mmol/L (ref 135–145)
Total Bilirubin: 0.8 mg/dL (ref 0.3–1.2)
Total Protein: 7.9 g/dL (ref 6.5–8.1)

## 2021-11-22 LAB — CBC WITH DIFFERENTIAL/PLATELET
Abs Immature Granulocytes: 0.04 10*3/uL (ref 0.00–0.07)
Basophils Absolute: 0.1 10*3/uL (ref 0.0–0.1)
Basophils Relative: 1 %
Eosinophils Absolute: 0.1 10*3/uL (ref 0.0–0.5)
Eosinophils Relative: 1 %
HCT: 49.2 % (ref 39.0–52.0)
Hemoglobin: 16.9 g/dL (ref 13.0–17.0)
Immature Granulocytes: 0 %
Lymphocytes Relative: 15 %
Lymphs Abs: 1.6 10*3/uL (ref 0.7–4.0)
MCH: 30.6 pg (ref 26.0–34.0)
MCHC: 34.3 g/dL (ref 30.0–36.0)
MCV: 89 fL (ref 80.0–100.0)
Monocytes Absolute: 0.8 10*3/uL (ref 0.1–1.0)
Monocytes Relative: 7 %
Neutro Abs: 8.2 10*3/uL — ABNORMAL HIGH (ref 1.7–7.7)
Neutrophils Relative %: 76 %
Platelets: 241 10*3/uL (ref 150–400)
RBC: 5.53 MIL/uL (ref 4.22–5.81)
RDW: 12.4 % (ref 11.5–15.5)
WBC: 10.8 10*3/uL — ABNORMAL HIGH (ref 4.0–10.5)
nRBC: 0 % (ref 0.0–0.2)

## 2021-11-22 LAB — SEDIMENTATION RATE: Sed Rate: 1 mm/hr (ref 0–16)

## 2021-11-22 LAB — C-REACTIVE PROTEIN: CRP: 0.5 mg/dL (ref <1.0)

## 2021-11-22 NOTE — ED Notes (Signed)
Pt family member requested time of how long before provider would give update after requesting updated on imaging. This RN explained that there were lags still pending, but would make provider aware. Provider Charm Barges updated.

## 2021-11-22 NOTE — ED Triage Notes (Signed)
Pt states "hurts my spine when I breathe in deep, having chest pain, knees pop when I move them, bilateral hip pain" x 1  month  Denies fever, Denies N/V

## 2021-11-22 NOTE — ED Notes (Signed)
Patient transported to X-ray 

## 2021-11-22 NOTE — ED Provider Notes (Signed)
[facility] EMERGENCY DEPARTMENT Provider Note   CSN: [id number] Arrival date & time: 11/22/21  1233     History {Add pertinent medical, surgical, social history, OB history to HPI:1} Chief Complaint  Patient presents with   multiple complaints     NIXXON FARIA is a 26 y.o. male.  He is brought in by his mother for multiple complaints that started about a month ago.  Reportedly started after going out for around and came back and noticed some blood in his urine once.  Since then has noticed some pain in his spine especially when he takes a deep breath.  Pain sometimes in his hips and his knees.  Intermittent pains in his left upper quadrant right lower quadrant.  Feels short of breath at times.  Said some tingling in his fingertips and his toes.  Has an appointment with primary care doctor coming up but symptoms today felt like they needed to be seen in the emergency department.  No fevers or chills.  No nausea vomiting diarrhea.  Denies tobacco.  On no medications.  Denies any tick bites.  The history is provided by the patient.  Hip Pain This is a new problem. The current episode started more than 1 week ago. The problem has not changed since onset.Associated symptoms include chest pain, abdominal pain and shortness of breath. Pertinent negatives include no headaches. Nothing relieves the symptoms. He has tried nothing for the symptoms. The treatment provided no relief.       Home Medications Prior to Admission medications   Not on File      Allergies    Patient has no known allergies.    Review of Systems   Review of Systems  Constitutional:  Negative for fever.  HENT:  Negative for sore throat.   Eyes:  Negative for visual disturbance.  Respiratory:  Positive for shortness of breath.   Cardiovascular:  Positive for chest pain.  Gastrointestinal:  Positive for abdominal pain. Negative for nausea and vomiting.  Genitourinary:  Positive for hematuria.   Musculoskeletal:  Positive for back pain.  Skin:  Negative for rash.  Neurological:  Positive for numbness. Negative for weakness and headaches.    Physical Exam Updated Vital Signs BP (!) 143/79 (BP Location: Right Arm)   Pulse 62   Temp 98.4 F (36.9 C) (Oral)   Resp 18   Ht 6\' 1"  (1.854 m)   Wt 72.6 kg   SpO2 98%   BMI 21.11 kg/m  Physical Exam Vitals and nursing note reviewed.  Constitutional:      General: He is not in acute distress.    Appearance: Normal appearance. He is well-developed.  HENT:     Head: Normocephalic and atraumatic.  Eyes:     Conjunctiva/sclera: Conjunctivae normal.  Cardiovascular:     Rate and Rhythm: Normal rate and regular rhythm.     Heart sounds: No murmur heard. Pulmonary:     Effort: Pulmonary effort is normal. No respiratory distress.     Breath sounds: Normal breath sounds.  Abdominal:     Palpations: Abdomen is soft.     Tenderness: There is no abdominal tenderness. There is no guarding or rebound.  Musculoskeletal:        General: Normal range of motion.     Cervical back: Neck supple.     Right lower leg: No edema.     Left lower leg: No edema.  Skin:    General: Skin is warm and dry.  Capillary Refill: Capillary refill takes less than 2 seconds.  Neurological:     General: No focal deficit present.     Mental Status: He is alert.     Sensory: No sensory deficit.     Motor: No weakness.     ED Results / Procedures / Treatments   Labs (all labs ordered are listed, but only abnormal results are displayed) Labs Reviewed  COMPREHENSIVE METABOLIC PANEL  CBC WITH DIFFERENTIAL/PLATELET  SEDIMENTATION RATE  C-REACTIVE PROTEIN    EKG None  Radiology No results found.  Procedures Procedures  {Document cardiac monitor, telemetry assessment procedure when appropriate:1}  Medications Ordered in ED Medications - No data to display  ED Course/ Medical Decision Making/ A&P                           Medical  Decision Making Amount and/or Complexity of Data Reviewed Labs: ordered. Radiology: ordered.  This patient complains of ***; this involves an extensive number of treatment Options and is a complaint that carries with it a high risk of complications and morbidity. The differential includes ***  I ordered, reviewed and interpreted labs, which included *** I ordered medication *** and reviewed PMP when indicated. I ordered imaging studies which included *** and I independently    visualized and interpreted imaging which showed *** Additional history obtained from *** Previous records obtained and reviewed *** I consulted *** and discussed lab and imaging findings and discussed disposition.  Cardiac monitoring reviewed, *** Social determinants considered, *** Critical Interventions: ***  After the interventions stated above, I reevaluated the patient and found *** Admission and further testing considered, ***    {Document critical care time when appropriate:1} {Document review of labs and clinical decision tools ie heart score, Chads2Vasc2 etc:1}  {Document your independent review of radiology images, and any outside records:1} {Document your discussion with family members, caretakers, and with consultants:1} {Document social determinants of health affecting pt's care:1} {Document your decision making why or why not admission, treatments were needed:1} Final Clinical Impression(s) / ED Diagnoses Final diagnoses:  None    Rx / DC Orders ED Discharge Orders     None

## 2021-11-22 NOTE — ED Notes (Signed)
Pt arrives ambulatory with a GCS of 15. Pt has multiple complaints from abdominal pain to issues with blood in his urine after a run. Patient states his issues began about 1 month ago.

## 2021-11-22 NOTE — ED Notes (Signed)
Pt back from x-ray.

## 2021-11-22 NOTE — Discharge Instructions (Signed)
You were seen in the emergency department for pain in your back your hips your knees intermittent chest pain shortness of breath intermittent abdominal pain intermittent tingling in your fingers and toes.  You had x-rays of your chest back and pelvis, lab work and EKG that did not show an obvious explanation for your symptoms.  Will be important for you to establish care with a primary care doctor so they can continue the work-up of your symptoms.  Return to the emergency department if any worsening or concerning symptoms

## 2021-11-23 ENCOUNTER — Emergency Department (HOSPITAL_BASED_OUTPATIENT_CLINIC_OR_DEPARTMENT_OTHER)
Admission: EM | Admit: 2021-11-23 | Discharge: 2021-11-23 | Disposition: A | Discharge status: Home / Self Care | Payer: Medicare Other | Attending: Emergency Medicine | Admitting: Emergency Medicine

## 2021-11-23 ENCOUNTER — Emergency Department (HOSPITAL_BASED_OUTPATIENT_CLINIC_OR_DEPARTMENT_OTHER): Payer: Medicare Other

## 2021-11-23 ENCOUNTER — Encounter (HOSPITAL_BASED_OUTPATIENT_CLINIC_OR_DEPARTMENT_OTHER): Payer: Self-pay

## 2021-11-23 DIAGNOSIS — R55 Syncope and collapse: Secondary | ICD-10-CM

## 2021-11-23 DIAGNOSIS — M549 Dorsalgia, unspecified: Secondary | ICD-10-CM | POA: Diagnosis not present

## 2021-11-23 DIAGNOSIS — I861 Scrotal varices: Secondary | ICD-10-CM | POA: Insufficient documentation

## 2021-11-23 DIAGNOSIS — I951 Orthostatic hypotension: Secondary | ICD-10-CM | POA: Insufficient documentation

## 2021-11-23 DIAGNOSIS — F1210 Cannabis abuse, uncomplicated: Secondary | ICD-10-CM | POA: Insufficient documentation

## 2021-11-23 LAB — RAPID URINE DRUG SCREEN, HOSP PERFORMED
Amphetamines: NOT DETECTED
Barbiturates: NOT DETECTED
Benzodiazepines: NOT DETECTED
Cocaine: NOT DETECTED
Opiates: NOT DETECTED
Tetrahydrocannabinol: POSITIVE — AB

## 2021-11-23 LAB — URINALYSIS, ROUTINE W REFLEX MICROSCOPIC
Bilirubin Urine: NEGATIVE
Glucose, UA: NEGATIVE mg/dL
Hgb urine dipstick: NEGATIVE
Ketones, ur: NEGATIVE mg/dL
Leukocytes,Ua: NEGATIVE
Nitrite: NEGATIVE
Protein, ur: NEGATIVE mg/dL
Specific Gravity, Urine: >=1.03 (ref 1.005–1.030)
pH: 5.5 (ref 5.0–8.0)

## 2021-11-23 LAB — BASIC METABOLIC PANEL
Anion gap: 8 (ref 5–15)
BUN: 19 mg/dL (ref 6–20)
CO2: 29 mmol/L (ref 22–32)
Calcium: 8.8 mg/dL — ABNORMAL LOW (ref 8.9–10.3)
Chloride: 101 mmol/L (ref 98–111)
Creatinine, Ser: 0.93 mg/dL (ref 0.61–1.24)
GFR, Estimated: >60 mL/min (ref >60)
Glucose, Bld: 141 mg/dL — ABNORMAL HIGH (ref 70–99)
Potassium: 3.6 mmol/L (ref 3.5–5.1)
Sodium: 138 mmol/L (ref 135–145)

## 2021-11-23 LAB — CBC
HCT: 44 % (ref 39.0–52.0)
Hemoglobin: 15.1 g/dL (ref 13.0–17.0)
MCH: 30.5 pg (ref 26.0–34.0)
MCHC: 34.3 g/dL (ref 30.0–36.0)
MCV: 88.9 fL (ref 80.0–100.0)
Platelets: 226 10*3/uL (ref 150–400)
RBC: 4.95 MIL/uL (ref 4.22–5.81)
RDW: 12.4 % (ref 11.5–15.5)
WBC: 9.6 10*3/uL (ref 4.0–10.5)
nRBC: 0 % (ref 0.0–0.2)

## 2021-11-23 LAB — TROPONIN I (HIGH SENSITIVITY): Troponin I (High Sensitivity): <2 ng/L (ref <18)

## 2021-11-23 LAB — D-DIMER, QUANTITATIVE: D-Dimer, Quant: <0.27 ug/mL-FEU (ref 0.00–0.50)

## 2021-11-23 LAB — CK: Total CK: 53 U/L (ref 49–397)

## 2021-11-23 MED ORDER — SODIUM CHLORIDE 0.9 % IV BOLUS
1000.0000 mL | Freq: Once | INTRAVENOUS | Status: AC
  Administered 2021-11-23: 1000 mL via INTRAVENOUS

## 2021-11-23 MED ORDER — SODIUM CHLORIDE 0.9 % IV SOLN: INTRAVENOUS | Status: DC

## 2021-11-23 NOTE — ED Provider Notes (Signed)
[facility] EMERGENCY DEPARTMENT Provider Note   CSN: [id number] Arrival date & time: 11/23/21  6295     History  Chief Complaint  Patient presents with   Loss of Consciousness    Alex Davis is a 26 y.o. male.  Patient presents to the ED with episode of syncope and testicular pain.  States he was trying to urinate and having trouble getting his stream started.  While standing at the toilet began to feel lightheaded, nauseous and hot and sweaty.  He was in between the toilet and the sink and states he passed out but was able to lower himself to the ground.  Did not hit his head.  Had to crawl to the other room to ask for help.  Still feeling somewhat lightheaded and dizzy.  He is passed out 1 time before in the setting of a collarbone fracture.  He denies any neck or back pain.  No chest pain or shortness of breath.  He has been having ongoing testicular pain for several weeks but reports the left testicle is hurting more tonight since before the syncopal episode. mother at bedside denies any family history of sudden cardiac death.  Patient with no known cardiac history.  No prescribed medications.  Does admit to marijuana use. Denies any headache.  Denies any unilateral weakness, numbness or tingling.  Denies any visual changes.  Patient seen in the ED earlier today for body aches, blood in the urine and had a reassuring work-up including labs and x-rays.  The history is provided by the patient and a parent.  Loss of Consciousness Associated symptoms: dizziness and nausea   Associated symptoms: no chest pain, no fever, no headaches, no shortness of breath and no vomiting        Home Medications Prior to Admission medications   Not on File      Allergies    Patient has no known allergies.    Review of Systems   Review of Systems  Constitutional:  Negative for activity change, appetite change and fever.  HENT:  Negative for congestion.   Respiratory:  Negative  for cough, chest tightness and shortness of breath.   Cardiovascular:  Positive for syncope. Negative for chest pain.  Gastrointestinal:  Positive for nausea. Negative for vomiting.  Genitourinary:  Positive for testicular pain. Negative for dysuria.  Musculoskeletal:  Negative for arthralgias and myalgias.  Skin:  Negative for rash.  Neurological:  Positive for dizziness, syncope and light-headedness. Negative for headaches.    Physical Exam Updated Vital Signs BP 105/63 (BP Location: Right Arm)   Pulse 60   Temp 97.7 F (36.5 C)   Resp 18   Ht 6' (1.829 m)   Wt 72.6 kg   SpO2 99%   BMI 21.70 kg/m  Physical Exam Vitals and nursing note reviewed.  Constitutional:      General: He is not in acute distress.    Appearance: He is well-developed.  HENT:     Head: Normocephalic and atraumatic.     Mouth/Throat:     Pharynx: No oropharyngeal exudate.  Eyes:     Conjunctiva/sclera: Conjunctivae normal.     Pupils: Pupils are equal, round, and reactive to light.  Neck:     Comments: No meningismus. Cardiovascular:     Rate and Rhythm: Normal rate and regular rhythm.     Heart sounds: Normal heart sounds. No murmur heard. Pulmonary:     Effort: Pulmonary effort is normal. No respiratory distress.  Breath sounds: Normal breath sounds.  Chest:     Chest wall: No tenderness.  Abdominal:     Palpations: Abdomen is soft.     Tenderness: There is no abdominal tenderness. There is no guarding or rebound.  Genitourinary:    Comments: Left testicle appears normal to inspection.  Diffusely tender.  No overlying erythema.  Normal lie Musculoskeletal:        General: No tenderness. Normal range of motion.     Cervical back: Normal range of motion and neck supple.  Skin:    General: Skin is warm.  Neurological:     General: No focal deficit present.     Mental Status: He is alert and oriented to person, place, and time. Mental status is at baseline.     Cranial Nerves: No cranial  nerve deficit.     Motor: No abnormal muscle tone.     Coordination: Coordination normal.     Comments:  5/5 strength throughout. CN 2-12 intact.Equal grip strength.   Psychiatric:        Behavior: Behavior normal.     ED Results / Procedures / Treatments   Labs (all labs ordered are listed, but only abnormal results are displayed) Labs Reviewed  BASIC METABOLIC PANEL - Abnormal; Notable for the following components:      Result Value   Glucose, Bld 141 (*)    Calcium 8.8 (*)    All other components within normal limits  RAPID URINE DRUG SCREEN, HOSP PERFORMED - Abnormal; Notable for the following components:   Tetrahydrocannabinol POSITIVE (*)    All other components within normal limits  CBC  URINALYSIS, ROUTINE W REFLEX MICROSCOPIC  D-DIMER, QUANTITATIVE  CK  TROPONIN I (HIGH SENSITIVITY)    EKG EKG Interpretation  Date/Time:  Wednesday November 23 2021 00:39:34 EDT Ventricular Rate:  58 PR Interval:  135 QRS Duration: 98 QT Interval:  410 QTC Calculation: 403 R Axis:   58 Text Interpretation: Sinus rhythm No significant change was found Confirmed by Glynn Octave [id number]) on 11/23/2021 12:44:05 AM  Radiology US SCROTUM W/DOPPLER  Result Date: 11/23/2021 CLINICAL DATA:  Left-sided testicular pain. EXAM: SCROTAL ULTRASOUND DOPPLER ULTRASOUND OF THE TESTICLES TECHNIQUE: Complete ultrasound examination of the testicles, epididymis, and other scrotal structures was performed. Color and spectral Doppler ultrasound were also utilized to evaluate blood flow to the testicles. COMPARISON:  None Available. FINDINGS: Right testicle Measurements: 4.3 cm x 2.0 cm x 3.1 cm. A punctate parenchymal calcification is seen within the right testicle. No mass is visualized. Left testicle Measurements: 4.2 cm x 2.1 cm x 2.8 cm. No mass or microlithiasis visualized. Right epididymis: A 5.1 mm cyst is seen within the head of the right epididymis. Left epididymis: A 3.6 mm cyst is seen within the  head of the left epididymis. Hydrocele:  There is a small left-sided hydrocele. Varicocele:  A left-sided varicocele is noted. Pulsed Doppler interrogation of both testes demonstrates normal low resistance arterial and venous waveforms bilaterally. IMPRESSION: 1. Small left-sided hydrocele. 2. Left-sided varicocele. Electronically Signed   By: Aram Candela M.D.   On: 11/23/2021 02:24   DG Pelvis 1-2 Views  Result Date: 11/22/2021 CLINICAL DATA:  Back pain.  Hip pain. EXAM: PELVIS - 1-2 VIEW COMPARISON:  None FINDINGS: Normal radiograph. IMPRESSION: Negative. Electronically Signed   By: Paulina Fusi M.D.   On: 11/22/2021 15:36   DG Lumbar Spine Complete  Result Date: 11/22/2021 CLINICAL DATA:  Back pain EXAM: LUMBAR SPINE - COMPLETE 4+  VIEW COMPARISON:  CT abdomen 09/24/2012 FINDINGS: No malalignment. Transitional anatomy either with small lumbar ribs or sacralization of the L5 vertebra. Nine transitional disc heights are normal. No evidence of facet arthropathy or pars defects. IMPRESSION: Transitional anatomy. Otherwise no remarkable finding. No degenerative change or focal lesion. Electronically Signed   By: Paulina Fusi M.D.   On: 11/22/2021 15:35   DG Chest 2 View  Result Date: 11/22/2021 CLINICAL DATA:  Chest pain and back pain, particularly with inspiration. EXAM: CHEST - 2 VIEW COMPARISON:  None FINDINGS: Heart and mediastinal shadows are normal. The lungs are clear. No effusions. Mild scoliotic curvature of the spine is noted. IMPRESSION: Mild scoliotic curvature of the spine. Otherwise normal chest radiography. Electronically Signed   By: Paulina Fusi M.D.   On: 11/22/2021 15:32    Procedures Procedures    Medications Ordered in ED Medications  sodium chloride 0.9 % bolus 1,000 mL (has no administration in time range)    ED Course/ Medical Decision Making/ A&P                           Medical Decision Making Amount and/or Complexity of Data Reviewed Labs: ordered.  Decision-making details documented in ED Course. Radiology: ordered and independent interpretation performed. Decision-making details documented in ED Course. ECG/medicine tests: ordered and independent interpretation performed. Decision-making details documented in ED Course.  Risk Prescription drug management.  Episode of syncope in setting of micturition.  Increased testicle pain which is acute on chronic issue.  No head injury.  No chest pain or shortness of breath.  EKG is sinus rhythm, no Brugada, no prolonged QT Low suspicion for cardiogenic cause of syncope. Nonfocal neurological exam.   We will hydrate, check orthostatics, testicular ultrasound, urinalysis.  Labs earlier showed normal blood counts and electrolytes.  D-dimer negative.  Low suspicion for pulmonary embolism or ACS.  CK is normal.  Orthostatics positive.  IV fluids given.  Blood pressure down to 86 systolic with standing.  Ultrasound is negative for testicular torsion but does show varicocele and hydrocele  Patient feels improved.  Tolerating p.o.  Blood pressure improved to 109/64.  Denies dizziness or lightheadedness.  No chest pain or shortness of breath.  Discussed p.o. hydration at home, PCP follow-up for possible echocardiogram though his syncope is favored to be due to micturition orthostasis.  Low suspicion for ACS or pulmonary embolism.  Discussed cessation of marijuana use.  Referred to urology regarding testicular discomfort varicocele. Return precautions discussed.       Final Clinical Impression(s) / ED Diagnoses Final diagnoses:  Syncope and collapse  Orthostasis  Varicocele    Rx / DC Orders ED Discharge Orders     None         Trenia Tennyson, Jeannett Senior, MD 11/23/21 0321

## 2021-11-23 NOTE — ED Notes (Signed)
ED Provider at bedside. 

## 2021-11-23 NOTE — ED Notes (Addendum)
Pt states he was urinating and began to feel lightheaded and passed out.  Denies hitting head or headache. Was seen here yesterday and labs etc checked out fine.  Pt states he has had testicle pain for several weeks and has increased.  Does report mariajuana use, denies any other drug use.  A & O x 3 and no neuro deficits.  Mom at bedside Iv started and NS bolus infusing

## 2021-11-23 NOTE — Discharge Instructions (Signed)
You passed out today in the setting trying to urinate and low blood pressure.  Keep yourself well-hydrated at home.  Stop using marijuana.  Follow-up with your primary doctor for further testing of your episode of passing out.  Your doctor may wish to perform an echocardiogram which is an ultrasound of your heart to check the pumping function. Follow-up with the urologist as well regarding the enlarged blood vessel in your testicle.  No evidence of blood supply problems with the physical tonight. Return to the ED with new or worsening symptoms

## 2021-11-23 NOTE — ED Triage Notes (Signed)
Pt reports testicular pain and loss of consciousness while trying to use the restroom. Denies hitting head.

## 2021-11-23 NOTE — ED Notes (Signed)
Pt laid flat at 2:03 for orthostatics

## 2023-09-06 ENCOUNTER — Ambulatory Visit (HOSPITAL_COMMUNITY): Admission: EM | Admit: 2023-09-06 | Discharge: 2023-09-06 | Disposition: A | Discharge status: Home / Self Care

## 2023-09-06 ENCOUNTER — Encounter (HOSPITAL_COMMUNITY): Payer: Self-pay

## 2023-09-06 DIAGNOSIS — R1084 Generalized abdominal pain: Secondary | ICD-10-CM | POA: Diagnosis not present

## 2023-09-06 DIAGNOSIS — T189XXA Foreign body of alimentary tract, part unspecified, initial encounter: Secondary | ICD-10-CM

## 2023-09-06 NOTE — ED Provider Notes (Signed)
 [facility]    CSN: [id number] Arrival date & time: 09/06/23  1602      History   Chief Complaint Chief Complaint  Patient presents with   Abdominal Pain    HPI Alex Davis is a 28 y.o. male.   28 year old male who presents urgent care with concerns of abdominal pain and possible ingestion of a small piece of glass.  He reports that 7 days ago he got up in the middle of the night and broke a mason jar.  He got a small piece of glass stuck in his thumb and he used his teeth to pull it out.  He is unsure if he swallowed the glass.  He reports that the next day he started having diarrhea but no abdominal pain.  The diarrhea stopped about 2 to 3 days later and then he developed some generalized abdominal pain.  The abdominal pain has persisted.  He denies any nausea, vomiting, blood in the stool, dark tarry stools, fever, chills, back pain, poor appetite, dysuria, hematuria, syncope.  He is eating and drinking normally.  He had a bowel movement this morning that was normal in color and consistency.  He has no history of having abdominal pain in the past.  He has not had any abdominal surgeries.    Abdominal Pain Associated symptoms: diarrhea   Associated symptoms: no chest pain, no chills, no cough, no dysuria, no fever, no hematuria, no nausea, no shortness of breath, no sore throat and no vomiting     Past Medical History:  Diagnosis Date   ADHD (attention deficit hyperactivity disorder)    off all medications for 6 months   Kidney stone    Kidney stones 09/2012   left ureteral stone    There are no active problems to display for this patient.   Past Surgical History:  Procedure Laterality Date   laser stone removal         Home Medications    Prior to Admission medications   Not on File    Family History Family History  Problem Relation Age of Onset   Stroke Other    Hypertension Other    Diabetes Other     Social History Social History    Tobacco Use   Smoking status: Never   Smokeless tobacco: Never  Substance Use Topics   Alcohol use: No   Drug use: Yes    Frequency: 7.0 times per week    Types: Marijuana    Comment: daily     Allergies   Patient has no known allergies.   Review of Systems Review of Systems  Constitutional:  Negative for appetite change, chills and fever.  HENT:  Negative for ear pain and sore throat.   Eyes:  Negative for pain and visual disturbance.  Respiratory:  Negative for cough and shortness of breath.   Cardiovascular:  Negative for chest pain and palpitations.  Gastrointestinal:  Positive for abdominal pain and diarrhea. Negative for abdominal distention, anal bleeding, blood in stool, nausea, rectal pain and vomiting.  Genitourinary:  Negative for dysuria and hematuria.  Musculoskeletal:  Negative for arthralgias and back pain.  Skin:  Negative for color change and rash.  Neurological:  Negative for seizures and syncope.  All other systems reviewed and are negative.    Physical Exam Triage Vital Signs ED Triage Vitals [09/06/23 1634]  Encounter Vitals Group     BP 119/76     Systolic BP Percentile  Diastolic BP Percentile      Pulse Rate 79     Resp 18     Temp 98.4 F (36.9 C)     Temp Source Oral     SpO2 96 %     Weight      Height      Head Circumference      Peak Flow      Pain Score 4     Pain Loc      Pain Education      Exclude from Growth Chart    No data found.  Updated Vital Signs BP 119/76 (BP Location: Left Arm)   Pulse 79   Temp 98.4 F (36.9 C) (Oral)   Resp 18   SpO2 96%   Visual Acuity Right Eye Distance:   Left Eye Distance:   Bilateral Distance:    Right Eye Near:   Left Eye Near:    Bilateral Near:     Physical Exam Vitals and nursing note reviewed.  Constitutional:      General: He is not in acute distress.    Appearance: He is well-developed.  HENT:     Head: Normocephalic and atraumatic.  Eyes:      Conjunctiva/sclera: Conjunctivae normal.  Cardiovascular:     Rate and Rhythm: Normal rate and regular rhythm.     Heart sounds: No murmur heard. Pulmonary:     Effort: Pulmonary effort is normal. No respiratory distress.     Breath sounds: Normal breath sounds.  Abdominal:     General: Abdomen is flat. Bowel sounds are normal. There is no distension.     Palpations: Abdomen is soft. There is no hepatomegaly or splenomegaly.     Tenderness: There is generalized abdominal tenderness (very mild, minimal tenderness that is more generalized). There is no right CVA tenderness, left CVA tenderness, guarding or rebound. Negative signs include Murphy's sign and McBurney's sign.     Hernia: There is no hernia in the umbilical area or ventral area.  Musculoskeletal:        General: No swelling.     Cervical back: Neck supple.  Skin:    General: Skin is warm and dry.     Capillary Refill: Capillary refill takes less than 2 seconds.  Neurological:     Mental Status: He is alert.  Psychiatric:        Mood and Affect: Mood normal.      UC Treatments / Results  Labs (all labs ordered are listed, but only abnormal results are displayed) Labs Reviewed - No data to display  EKG   Radiology No results found.  Procedures Procedures (including critical care time)  Medications Ordered in UC Medications - No data to display  Initial Impression / Assessment and Plan / UC Course  I have reviewed the triage vital signs and the nursing notes.  Pertinent labs & imaging results that were available during my care of the patient were reviewed by me and considered in my medical decision making (see chart for details).     Generalized abdominal pain  Swallowed foreign body, initial encounter   Possible ingestion of a very small piece of glass that was 7 days ago.  He did not start developing abdominal pain until 3 to 4 days ago which was several days after the ingestion.  He is not having any  bloody stools, dark tarry stools, nausea, vomiting, syncope, poor appetite or other concerning symptoms for possible internal injury.  He has  a benign abdominal exam and his vital signs are stable here.  Reassured the patient that likely the abdominal pain is not associated with the glass although we did advise him that we cannot 100% guarantee that and that should he develop worsening symptoms, bloody stools, dark tarry stools then he would need to go to the emergency room.  Also advised that if he feels he is not any better in 2 to 3 days that he would need to go to the emergency department.  His abdominal pain may be associated with the several days of diarrhea that he had developed which was likely more of an infectious nature.  Final Clinical Impressions(s) / UC Diagnoses   Final diagnoses:  Generalized abdominal pain  Swallowed foreign body, initial encounter     Discharge Instructions      Possible ingestion of a piece of glass 7 days ago but abdominal pain did not develop until 3 to 4 days ago.  Likely this is not associated especially as you are not having any blood in the stool, dark tarry stools, nausea, vomiting, poor appetite, fevers or other symptoms that would suggest internal injury.  There was diarrhea that occurred shortly after ingestion which likely was more of an infectious diarrhea and this could cause some abdominal pain and bloating.  Reassuringly your abdominal exam does not show any concerning signs and your vital signs are good.  At this time recommend monitoring your symptoms and if they persist for another 2 to 3 days then recommend going to the emergency room.  If you develop blood in your stool or dark tarry stools then recommend going to the emergency room immediately.   ED Prescriptions   None    PDMP not reviewed this encounter.   Kreg Pesa, PA-C 09/06/23 1721

## 2023-09-06 NOTE — ED Triage Notes (Signed)
 Pt states he is unsure if he swallowed a piece of glass a week ago. C/o abdominal pain "feels like its tearing me up". Denies bloody stools or vomiting.

## 2023-09-06 NOTE — Discharge Instructions (Addendum)
 Possible ingestion of a piece of glass 7 days ago but abdominal pain did not develop until 3 to 4 days ago.  Likely this is not associated especially as you are not having any blood in the stool, dark tarry stools, nausea, vomiting, poor appetite, fevers or other symptoms that would suggest internal injury.  There was diarrhea that occurred shortly after ingestion which likely was more of an infectious diarrhea and this could cause some abdominal pain and bloating.  Reassuringly your abdominal exam does not show any concerning signs and your vital signs are good.  At this time recommend monitoring your symptoms and if they persist for another 2 to 3 days then recommend going to the emergency room.  If you develop blood in your stool or dark tarry stools then recommend going to the emergency room immediately.

## 2024-03-04 ENCOUNTER — Ambulatory Visit (HOSPITAL_COMMUNITY)
Admission: EM | Admit: 2024-03-04 | Discharge: 2024-03-04 | Disposition: A | Discharge status: Home / Self Care | Attending: Physician Assistant | Admitting: Physician Assistant

## 2024-03-04 ENCOUNTER — Encounter (HOSPITAL_COMMUNITY): Payer: Self-pay

## 2024-03-04 DIAGNOSIS — M542 Cervicalgia: Secondary | ICD-10-CM

## 2024-03-04 DIAGNOSIS — M62838 Other muscle spasm: Secondary | ICD-10-CM | POA: Diagnosis not present

## 2024-03-04 MED ORDER — PREDNISONE 20 MG PO TABS: ORAL_TABLET | ORAL | 0 refills | Status: AC

## 2024-03-04 NOTE — ED Provider Notes (Signed)
 [facility]    CSN: [id number] Arrival date & time: 03/04/24  9070      History   Chief Complaint Chief Complaint  Patient presents with   Neck Pain    HPI Alex Davis is a 28 y.o. male.  has a past medical history of ADHD (attention deficit hyperactivity disorder), Kidney stone, and Kidney stones (09/2012).   HPI  Discussed the use of AI scribe software for clinical note transcription with the patient, who gave verbal consent to proceed. The patient presents with neck, shoulder, and spine pain.  The patient has been experiencing pain in the neck, shoulder, and spine area for the past few months. The pain is localized to the left side, more towards the back, and worsens with activity. It is severe enough to cause crying upon waking due to its intensity and is present even when at rest.  There is no history of injuries, trauma, or heavy lifting that could have contributed to the pain. He has not experienced similar symptoms in the past. He has been using [facility] for relief, which provides minimal help, and is not taking any oral medications for the pain.  No numbness, tingling, or weakness in the arms.     Past Medical History:  Diagnosis Date   ADHD (attention deficit hyperactivity disorder)    off all medications for 6 months   Kidney stone    Kidney stones 09/2012   left ureteral stone    There are no active problems to display for this patient.   Past Surgical History:  Procedure Laterality Date   laser stone removal         Home Medications    Prior to Admission medications   Medication Sig Start Date End Date Taking? Authorizing Provider  predniSONE  (DELTASONE ) 20 MG tablet Take 60mg  PO daily x 2 days, then40mg  PO daily x 2 days, then 20mg  PO daily x 3 days 03/04/24  Yes Debrina Kizer E, PA-C    Family History Family History  Problem Relation Age of Onset   Healthy Mother    Healthy Father    Stroke Other    Hypertension Other     Diabetes Other     Social History Social History   Tobacco Use   Smoking status: Never   Smokeless tobacco: Never  Vaping Use   Vaping status: Never Used  Substance Use Topics   Alcohol use: No   Drug use: Yes    Frequency: 7.0 times per week    Types: Marijuana    Comment: daily     Allergies   Patient has no known allergies.   Review of Systems Review of Systems  Musculoskeletal:  Positive for back pain and neck pain.     Physical Exam Triage Vital Signs ED Triage Vitals [03/04/24 0942]  Encounter Vitals Group     BP 124/80     Girls Systolic BP Percentile      Girls Diastolic BP Percentile      Boys Systolic BP Percentile      Boys Diastolic BP Percentile      Pulse Rate 63     Resp 16     Temp (!) 97.5 F (36.4 C)     Temp Source Oral     SpO2 97 %     Weight      Height      Head Circumference      Peak Flow      Pain Score  6     Pain Loc      Pain Education      Exclude from Growth Chart    No data found.  Updated Vital Signs BP 124/80 (BP Location: Right Arm)   Pulse 63   Temp (!) 97.5 F (36.4 C) (Oral)   Resp 16   SpO2 97%   Visual Acuity Right Eye Distance:   Left Eye Distance:   Bilateral Distance:    Right Eye Near:   Left Eye Near:    Bilateral Near:     Physical Exam Vitals reviewed.  Constitutional:      General: He is awake.     Appearance: Normal appearance. He is well-developed and well-groomed.  HENT:     Head: Normocephalic and atraumatic.  Eyes:     Extraocular Movements: Extraocular movements intact.     Conjunctiva/sclera: Conjunctivae normal.  Pulmonary:     Effort: Pulmonary effort is normal.  Musculoskeletal:     Cervical back: Normal range of motion. Pain with movement and muscular tenderness present. No spinous process tenderness. Normal range of motion.  Neurological:     Mental Status: He is alert and oriented to person, place, and time.  Psychiatric:        Attention and Perception: Attention  normal.        Mood and Affect: Mood normal.        Speech: Speech normal.        Behavior: Behavior normal. Behavior is cooperative.      UC Treatments / Results  Labs (all labs ordered are listed, but only abnormal results are displayed) Labs Reviewed - No data to display  EKG   Radiology No results found.  Procedures Procedures (including critical care time)  Medications Ordered in UC Medications - No data to display  Initial Impression / Assessment and Plan / UC Course  I have reviewed the triage vital signs and the nursing notes.  Pertinent labs & imaging results that were available during my care of the patient were reviewed by me and considered in my medical decision making (see chart for details).      Final Clinical Impressions(s) / UC Diagnoses   Final diagnoses:  Neck pain  Muscle spasms of neck   Left neck and upper back muscle strain Chronic left neck and upper back muscle strain for a few months, exacerbated by movement and activity. No numbness, tingling, or weakness. Pain localized to the left side, extending to the head and back. Likely muscle strain based on symptoms and physical examination. - Recommended acetaminophen  or ibuprofen  for pain management. - Advised warm compresses to the affected area. - Provided stretching exercises. - Prescribed a steroid pack to reduce inflammation. - Recommend follow up with  orthopedics if symptoms do not improve for further evaluation and potential physical therapy.    Discharge Instructions      VISIT SUMMARY:  You came in today because you have been experiencing pain in your neck, shoulder, and spine for the past few months. The pain is on the left side and gets worse with activity. It is severe enough to cause crying upon waking and is present even when you are at rest. You have not had any injuries or trauma that could have caused this pain, and you have not experienced similar symptoms in the past. You  have been using [facility] for relief, which provides minimal help, and are not taking any oral medications for the pain. There is no  numbness, tingling, or weakness in your arms.  YOUR PLAN:  -LEFT NECK AND UPPER BACK MUSCLE STRAIN: You have a chronic muscle strain in your left neck and upper back, which has been causing pain for a few months. This type of strain occurs when muscles are overstretched or torn, often due to overuse or improper use. To manage the pain, you can take acetaminophen  or ibuprofen . Applying warm compresses to the affected area can also help. I have provided you with some stretching exercises to do at home. Additionally, I have prescribed a steroid pack to reduce inflammation. If your symptoms do not improve, I recommend you go to orthopedics for further evaluation and potential physical therapy.  I have sent in a script for Prednisone  taper to be taken in the morning with breakfast per the instructions on the container Remember that steroids can cause sleeplessness, irritability, increased hunger and elevated glucose levels so be mindful of these side effects. They should lessen as you progress to the lower doses of the taper.   INSTRUCTIONS:  Please take acetaminophen  or ibuprofen  as needed for pain. Use warm compresses on the affected area and perform the stretching exercises provided. Take the prescribed steroid pack as directed. If your symptoms do not improve, please follow up with orthopedics for further evaluation and potential physical therapy.   [facility] [address], [place], [place] [address] [phone]  [facility] [address], [place], [place] [address]  [phone]       ED Prescriptions     Medication Sig Dispense Auth. Provider   predniSONE  (DELTASONE ) 20 MG tablet Take 60mg  PO daily x 2 days, then40mg  PO daily x 2 days, then 20mg  PO daily x 3 days 13 tablet Alaylah Heatherington E, PA-C      PDMP not reviewed  this encounter.   Marylene Rocky BRAVO, PA-C 03/04/24 1551

## 2024-03-04 NOTE — ED Triage Notes (Signed)
 Patient c/o left lateral neck pain that worsens when he turns his neck or moves his left arm/shoulder in a circular motion x 2-3 months.  Patient states he has been using [employer].

## 2024-03-04 NOTE — Discharge Instructions (Addendum)
 VISIT SUMMARY:  You came in today because you have been experiencing pain in your neck, shoulder, and spine for the past few months. The pain is on the left side and gets worse with activity. It is severe enough to cause crying upon waking and is present even when you are at rest. You have not had any injuries or trauma that could have caused this pain, and you have not experienced similar symptoms in the past. You have been using [facility] for relief, which provides minimal help, and are not taking any oral medications for the pain. There is no numbness, tingling, or weakness in your arms.  YOUR PLAN:  -LEFT NECK AND UPPER BACK MUSCLE STRAIN: You have a chronic muscle strain in your left neck and upper back, which has been causing pain for a few months. This type of strain occurs when muscles are overstretched or torn, often due to overuse or improper use. To manage the pain, you can take acetaminophen  or ibuprofen . Applying warm compresses to the affected area can also help. I have provided you with some stretching exercises to do at home. Additionally, I have prescribed a steroid pack to reduce inflammation. If your symptoms do not improve, I recommend you go to orthopedics for further evaluation and potential physical therapy.  I have sent in a script for Prednisone  taper to be taken in the morning with breakfast per the instructions on the container Remember that steroids can cause sleeplessness, irritability, increased hunger and elevated glucose levels so be mindful of these side effects. They should lessen as you progress to the lower doses of the taper.   INSTRUCTIONS:  Please take acetaminophen  or ibuprofen  as needed for pain. Use warm compresses on the affected area and perform the stretching exercises provided. Take the prescribed steroid pack as directed. If your symptoms do not improve, please follow up with orthopedics for further evaluation and potential physical  therapy.   [facility] [address], [address], [place], [place] [address] [phone]  [facility] [address] [place], [place] [address]  [phone]
